# Patient Record
Sex: Male | Born: 2000 | Race: White | Hispanic: No | Marital: Single | Smoking: Never smoker
Health system: Southern US, Community
[De-identification: ages and names within clinical notes are randomized; demographics above are authoritative.]

---
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
## 2022-03-08 IMAGING — DX DG ANKLE COMPLETE 3+V*R*
3 series · 3 of 3 positions shown · non-contrast
Comparison: None.

CLINICAL DATA: Acute right ankle pain after injury yesterday.

EXAM:
RIGHT ANKLE - COMPLETE 3+ VIEW

[ankle ap]
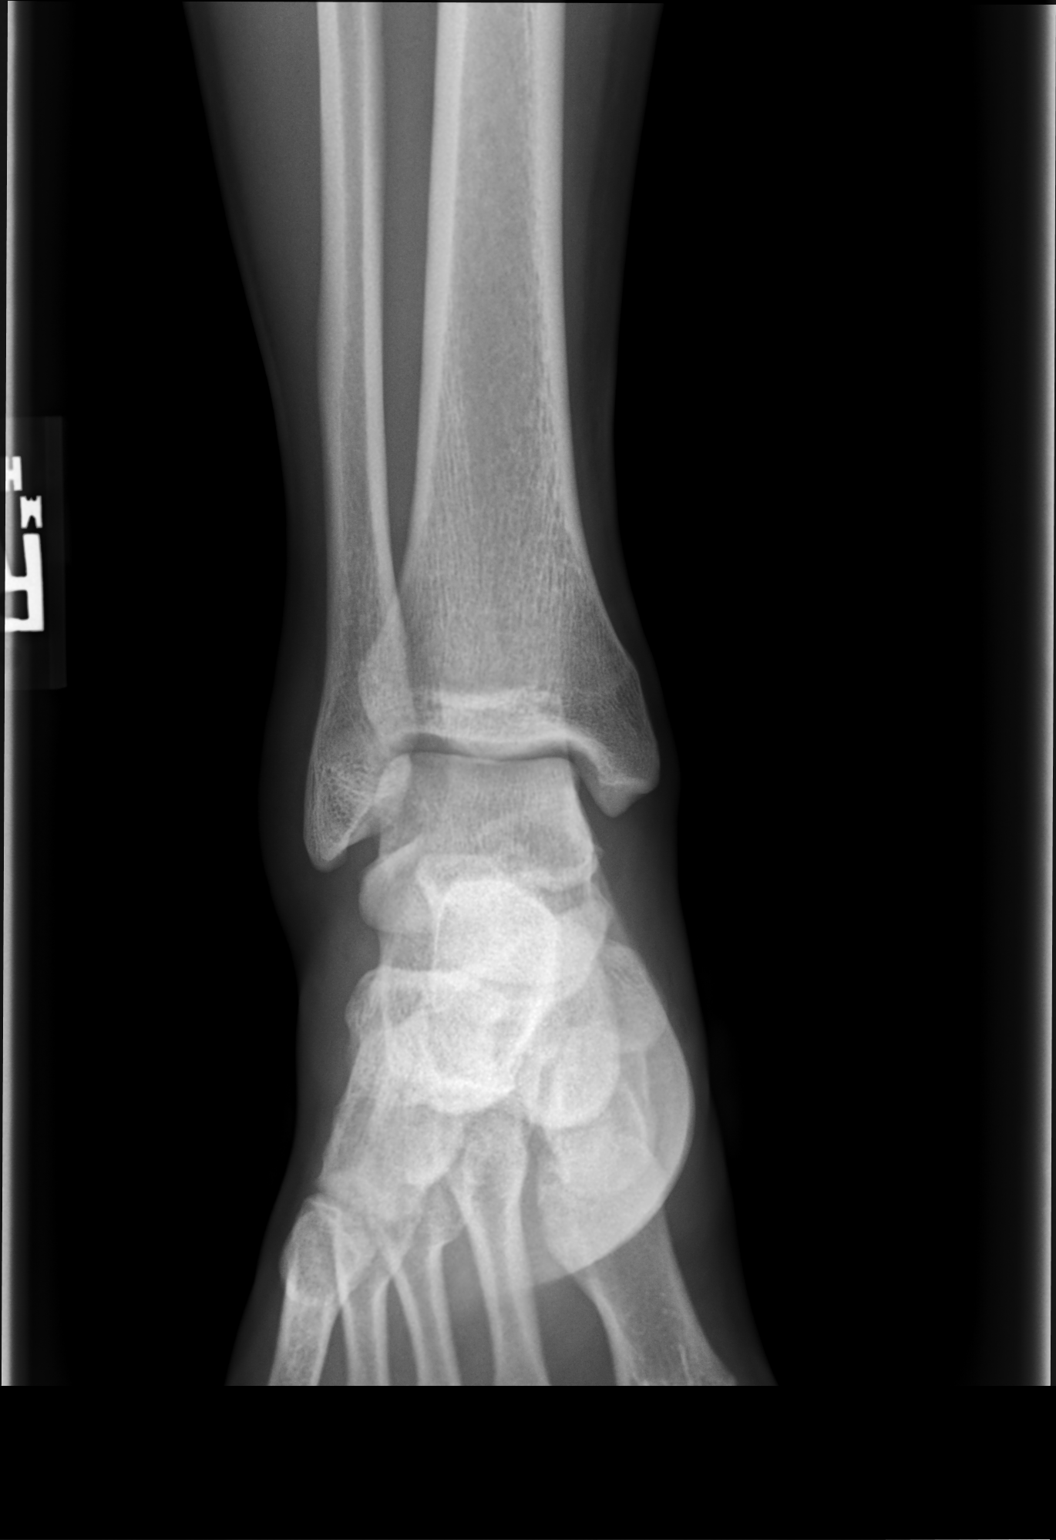

[ankle mlo]
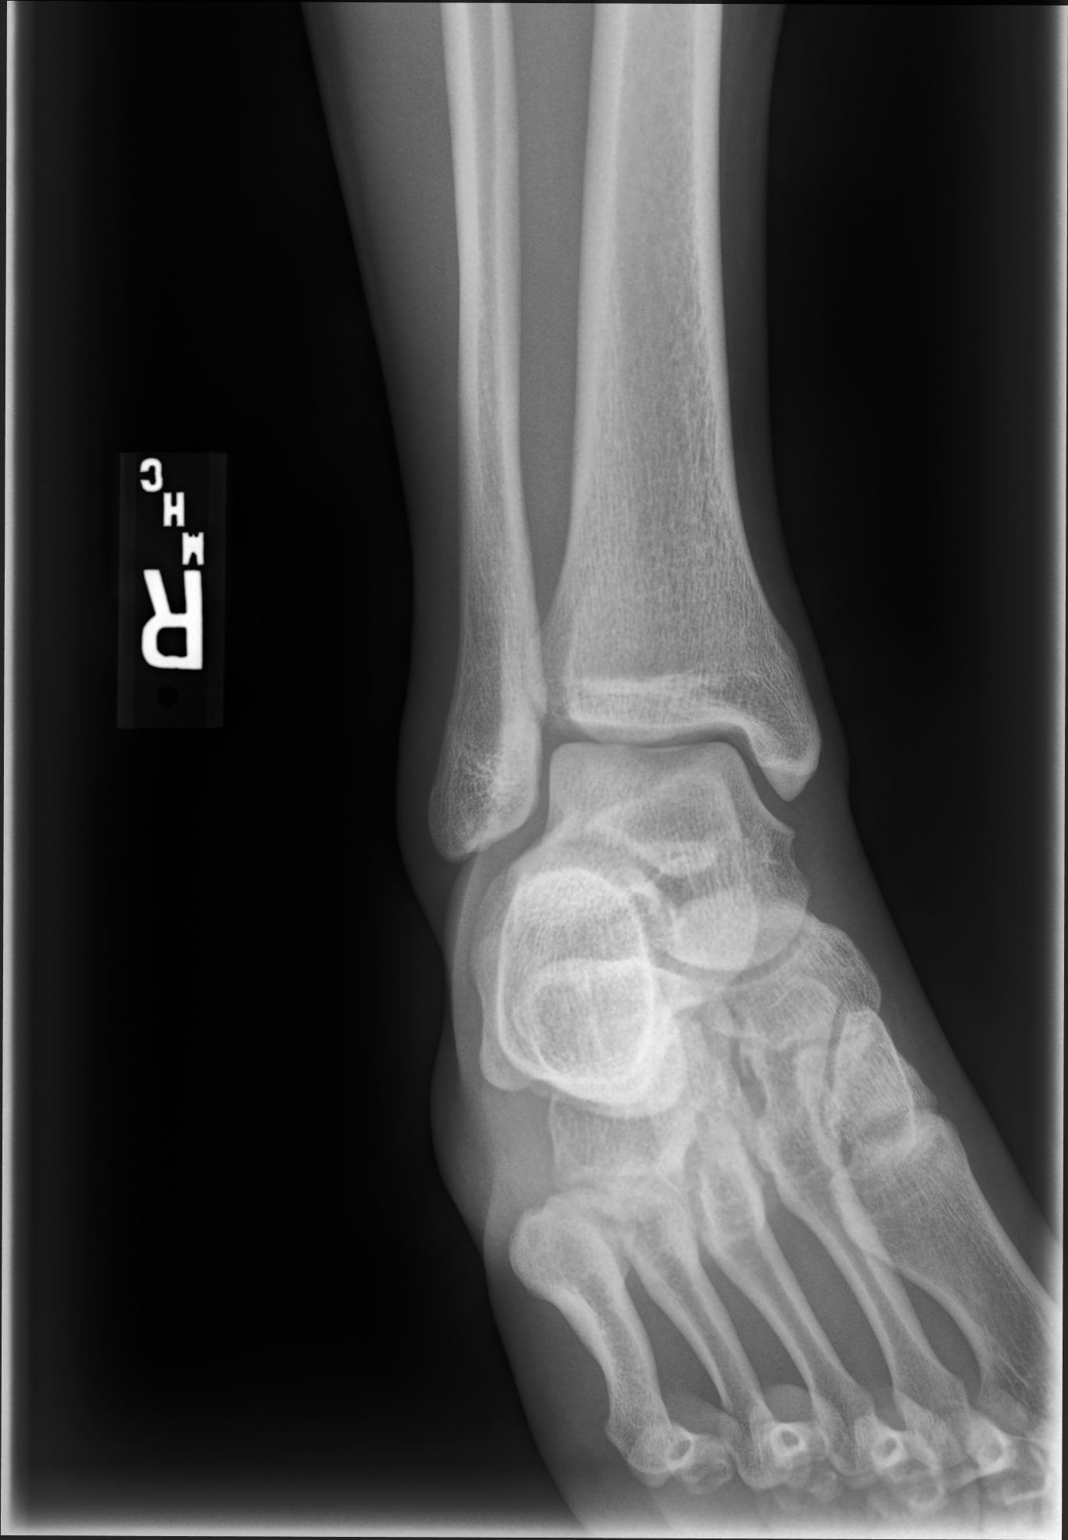

[ankle lat]
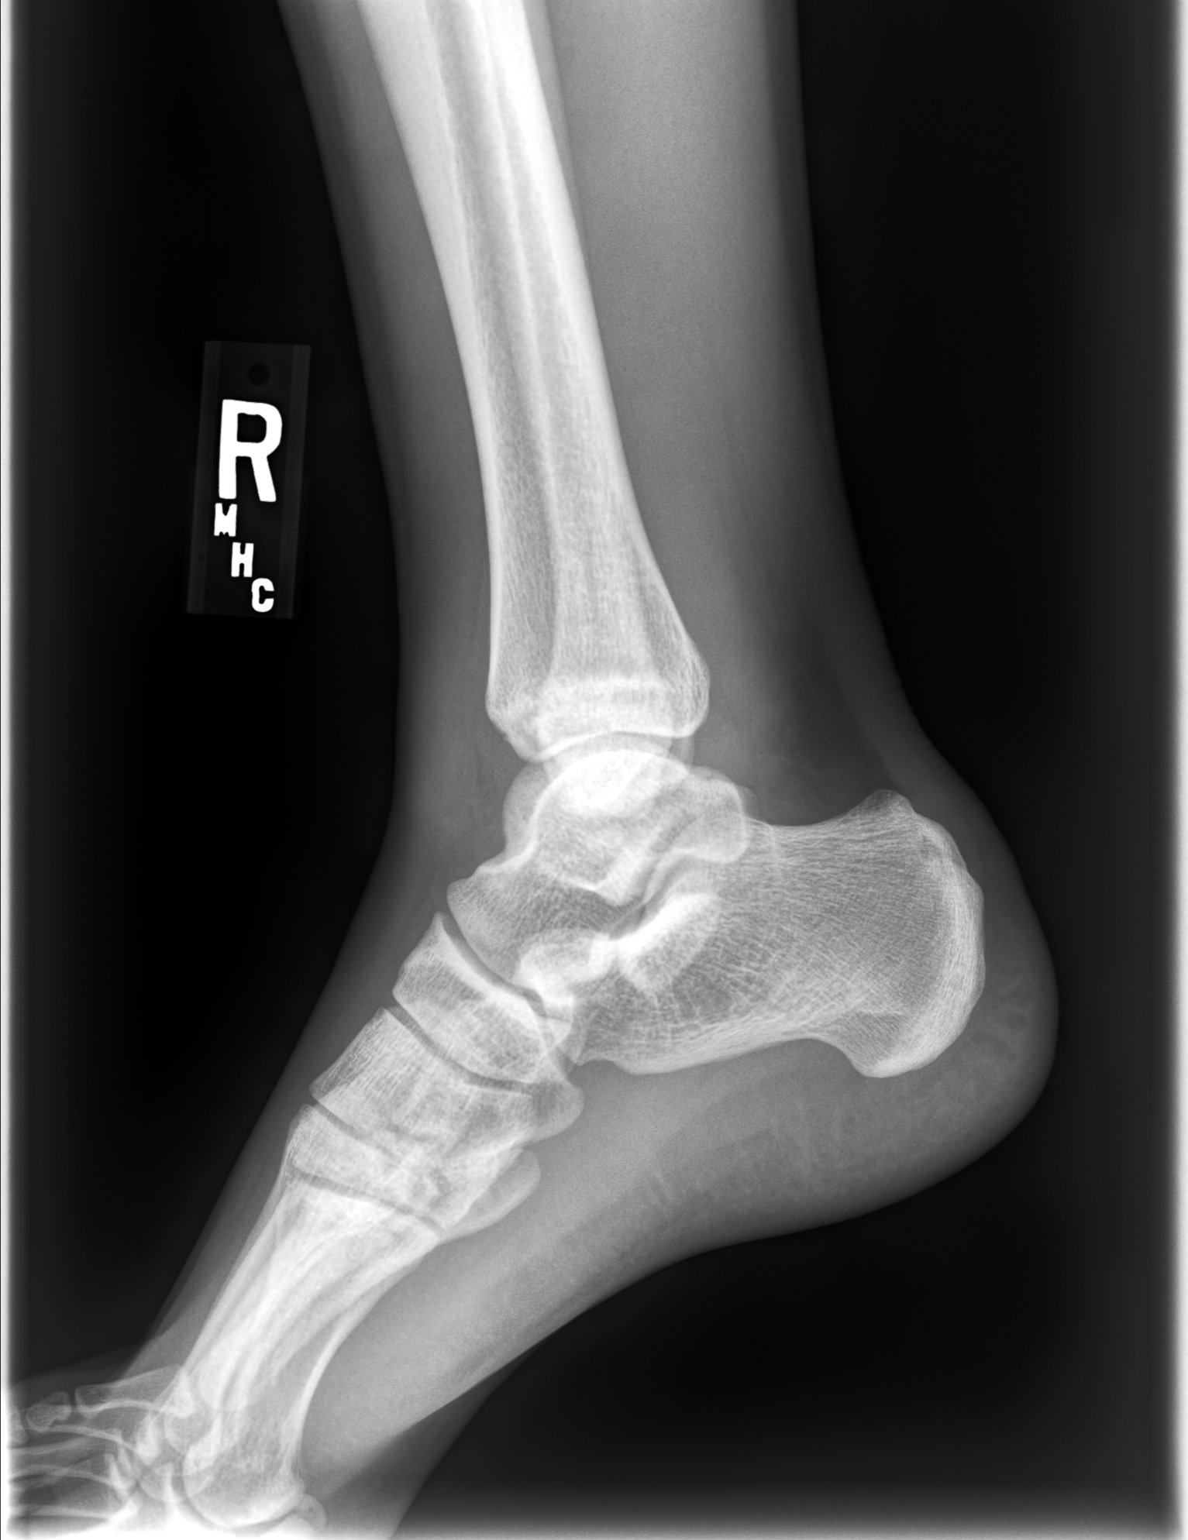

[3 of 3 positions shown; findings below may reference images not displayed]

FINDINGS: There is no evidence of fracture, dislocation, or joint effusion.
There is no evidence of arthropathy or other focal bone abnormality.
Mild soft tissue swelling is seen over lateral malleolus.
IMPRESSION: No fracture or dislocation is noted. Mild soft tissue swelling is
seen over lateral malleolus.

## 2023-02-09 DIAGNOSIS — L700 Acne vulgaris: Secondary | ICD-10-CM | POA: Diagnosis not present

## 2023-02-11 DIAGNOSIS — L700 Acne vulgaris: Secondary | ICD-10-CM | POA: Diagnosis not present

## 2023-02-15 DIAGNOSIS — J301 Allergic rhinitis due to pollen: Secondary | ICD-10-CM | POA: Diagnosis not present

## 2023-02-21 DIAGNOSIS — J301 Allergic rhinitis due to pollen: Secondary | ICD-10-CM | POA: Diagnosis not present

## 2023-03-22 DIAGNOSIS — K130 Diseases of lips: Secondary | ICD-10-CM | POA: Diagnosis not present

## 2023-03-22 DIAGNOSIS — L853 Xerosis cutis: Secondary | ICD-10-CM | POA: Diagnosis not present

## 2023-03-22 DIAGNOSIS — Z79899 Other long term (current) drug therapy: Secondary | ICD-10-CM | POA: Diagnosis not present

## 2023-03-22 DIAGNOSIS — L700 Acne vulgaris: Secondary | ICD-10-CM | POA: Diagnosis not present

## 2023-03-23 DIAGNOSIS — L700 Acne vulgaris: Secondary | ICD-10-CM | POA: Diagnosis not present

## 2023-03-23 DIAGNOSIS — L853 Xerosis cutis: Secondary | ICD-10-CM | POA: Diagnosis not present

## 2023-03-23 DIAGNOSIS — K130 Diseases of lips: Secondary | ICD-10-CM | POA: Diagnosis not present

## 2023-04-12 DIAGNOSIS — Z113 Encounter for screening for infections with a predominantly sexual mode of transmission: Secondary | ICD-10-CM | POA: Diagnosis not present

## 2023-04-12 DIAGNOSIS — Z1330 Encounter for screening examination for mental health and behavioral disorders, unspecified: Secondary | ICD-10-CM | POA: Diagnosis not present

## 2023-04-12 DIAGNOSIS — Z23 Encounter for immunization: Secondary | ICD-10-CM | POA: Diagnosis not present

## 2023-04-12 DIAGNOSIS — Z0000 Encounter for general adult medical examination without abnormal findings: Secondary | ICD-10-CM | POA: Diagnosis not present

## 2023-04-12 DIAGNOSIS — Z7189 Other specified counseling: Secondary | ICD-10-CM | POA: Diagnosis not present

## 2023-04-12 DIAGNOSIS — Z713 Dietary counseling and surveillance: Secondary | ICD-10-CM | POA: Diagnosis not present

## 2023-04-26 DIAGNOSIS — L700 Acne vulgaris: Secondary | ICD-10-CM | POA: Diagnosis not present

## 2023-04-26 DIAGNOSIS — K130 Diseases of lips: Secondary | ICD-10-CM | POA: Diagnosis not present

## 2023-04-26 DIAGNOSIS — L853 Xerosis cutis: Secondary | ICD-10-CM | POA: Diagnosis not present

## 2023-04-26 DIAGNOSIS — Z79899 Other long term (current) drug therapy: Secondary | ICD-10-CM | POA: Diagnosis not present

## 2023-05-26 DIAGNOSIS — L700 Acne vulgaris: Secondary | ICD-10-CM | POA: Diagnosis not present

## 2023-06-30 DIAGNOSIS — L700 Acne vulgaris: Secondary | ICD-10-CM | POA: Diagnosis not present

## 2023-07-21 DIAGNOSIS — L700 Acne vulgaris: Secondary | ICD-10-CM | POA: Diagnosis not present

## 2023-08-04 DIAGNOSIS — L700 Acne vulgaris: Secondary | ICD-10-CM | POA: Diagnosis not present

## 2023-09-08 DIAGNOSIS — L700 Acne vulgaris: Secondary | ICD-10-CM | POA: Diagnosis not present

## 2023-09-14 DIAGNOSIS — J301 Allergic rhinitis due to pollen: Secondary | ICD-10-CM | POA: Diagnosis not present

## 2023-09-15 DIAGNOSIS — J301 Allergic rhinitis due to pollen: Secondary | ICD-10-CM | POA: Diagnosis not present

## 2023-09-19 DIAGNOSIS — J301 Allergic rhinitis due to pollen: Secondary | ICD-10-CM | POA: Diagnosis not present

## 2024-02-02 DIAGNOSIS — S638X1A Sprain of other part of right wrist and hand, initial encounter: Secondary | ICD-10-CM | POA: Diagnosis not present

## 2024-02-02 DIAGNOSIS — S63641A Sprain of metacarpophalangeal joint of right thumb, initial encounter: Secondary | ICD-10-CM | POA: Diagnosis not present

## 2024-02-02 DIAGNOSIS — M79644 Pain in right finger(s): Secondary | ICD-10-CM | POA: Diagnosis not present

## 2024-02-02 DIAGNOSIS — Y9366 Activity, soccer: Secondary | ICD-10-CM | POA: Diagnosis not present

## 2024-02-14 DIAGNOSIS — W19XXXA Unspecified fall, initial encounter: Secondary | ICD-10-CM | POA: Diagnosis not present

## 2024-02-14 DIAGNOSIS — Y929 Unspecified place or not applicable: Secondary | ICD-10-CM | POA: Diagnosis not present

## 2024-02-14 DIAGNOSIS — S638X1A Sprain of other part of right wrist and hand, initial encounter: Secondary | ICD-10-CM | POA: Diagnosis not present

## 2024-02-14 DIAGNOSIS — S63601A Unspecified sprain of right thumb, initial encounter: Secondary | ICD-10-CM | POA: Diagnosis not present

## 2024-02-14 DIAGNOSIS — S63641A Sprain of metacarpophalangeal joint of right thumb, initial encounter: Secondary | ICD-10-CM | POA: Diagnosis not present
